# Patient Record
Sex: Female | Born: 1995 | Race: Black or African American | Hispanic: No | Smoking: Never smoker
Health system: Southern US, Community
[De-identification: ages and names within clinical notes are randomized; demographics above are authoritative.]

## PROBLEM LIST (undated history)

## (undated) DIAGNOSIS — Z789 Other specified health status: Secondary | ICD-10-CM

---
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
## 2018-02-28 ENCOUNTER — Ambulatory Visit (HOSPITAL_COMMUNITY)
Admission: EM | Admit: 2018-02-28 | Discharge: 2018-02-28 | Disposition: A | Discharge status: Home / Self Care | Payer: BLUE CROSS/BLUE SHIELD | Attending: Family Medicine | Admitting: Family Medicine

## 2018-02-28 ENCOUNTER — Encounter (HOSPITAL_COMMUNITY): Payer: Self-pay | Admitting: Emergency Medicine

## 2018-02-28 DIAGNOSIS — K648 Other hemorrhoids: Secondary | ICD-10-CM | POA: Diagnosis not present

## 2018-02-28 DIAGNOSIS — K625 Hemorrhage of anus and rectum: Secondary | ICD-10-CM | POA: Diagnosis not present

## 2018-02-28 DIAGNOSIS — K649 Unspecified hemorrhoids: Secondary | ICD-10-CM

## 2018-02-28 MED ORDER — RANITIDINE HCL 150 MG PO CAPS: 150.0000 mg | ORAL_CAPSULE | Freq: Every day | ORAL | 0 refills | Status: DC

## 2018-02-28 MED ORDER — HYDROCORTISONE 1 % RE CREA: 1.0000 | TOPICAL_CREAM | Freq: Two times a day (BID) | RECTAL | 0 refills | Status: DC

## 2018-02-28 NOTE — ED Triage Notes (Signed)
Pt here for bright red blood in stool today; pt denies pain and thinks she could have hemorrhoids

## 2018-02-28 NOTE — Discharge Instructions (Addendum)
It was nice meeting you!!  You have a small external hemorrhoid. We will give you some cream for that.  I am also giving you some medication for acid reflux.  Follow up as needed for continued or worsening symptoms. Avoid spicy, greasy foods, caffeine, chocolate and milk products.  No eating 2-3 hours before bedtime. Elevate the head of the bed 30 degrees.  Try this for a few weeks to see if this improves your symptoms.  If you don't see any improvement or your symptoms worsen please follow up with a GI

## 2018-02-28 NOTE — ED Provider Notes (Addendum)
[facility]    CSN: [id number] Arrival date & time: 02/28/18  1040     History   Chief Complaint Chief Complaint  Patient presents with  . Rectal Bleeding    HPI Sylvia Mack is a 22 y.o. female.   Pt is a healthy 22 year old female that presents with rectal bleeding this am. She had a BM and noticed bright red blood in the toilet and when she wiped.  She has been having some constipation issues due to diet change from acid reflux.She has been taking TUMS for the acid reflux.  She had some rectal pain previously but that has improved. She denies any abdominal pain, dysuria, hematuria, vaginal discharge or bleeding. She denies any vomiting or diarrhea.   She does not smoke. No hx of hemorrhoids.  No related family hx.  Past medical hx reviewed.   ROS per HPI      History reviewed. No pertinent past medical history.  There are no active problems to display for this patient.   History reviewed. No pertinent surgical history.  OB History   None      Home Medications    Prior to Admission medications   Medication Sig Start Date End Date Taking? Authorizing Provider  hydrocortisone (PROCTOCORT) 1 % CREA Apply 1 Tube topically 2 (two) times daily. 02/28/18   Dahlia Byes A, NP  ranitidine (ZANTAC) 150 MG capsule Take 1 capsule (150 mg total) by mouth daily. 02/28/18   Janace Aris, NP    Family History History reviewed. No pertinent family history.  Social History Social History   Tobacco Use  . Smoking status: Never Smoker  . Smokeless tobacco: Never Used  Substance Use Topics  . Alcohol use: Not on file  . Drug use: Never     Allergies   Patient has no known allergies.   Review of Systems Review of Systems   Physical Exam Triage Vital Signs ED Triage Vitals [02/28/18 1053]  Enc Vitals Group     BP (!) 131/59     Pulse Rate 80     Resp 16     Temp 99 F (37.2 C)     Temp Source Oral     SpO2 100 %     Weight      Height      Head Circumference      Peak Flow      Pain Score      Pain Loc      Pain Edu?      Excl. in [place]?    No data found.  Updated Vital Signs BP (!) 131/59 (BP Location: Left Arm)   Pulse 80   Temp 99 F (37.2 C) (Oral)   Resp 16   SpO2 100%   Visual Acuity Right Eye Distance:   Left Eye Distance:   Bilateral Distance:    Right Eye Near:   Left Eye Near:    Bilateral Near:     Physical Exam  Constitutional: She is oriented to person, place, and time. She appears well-developed and well-nourished.  Very pleasant. Non toxic or ill appearing.   HENT:  Head: Normocephalic and atraumatic.  Eyes: Conjunctivae are normal.  Neck: Normal range of motion.  Pulmonary/Chest: Effort normal.  Abdominal: Soft. Bowel sounds are normal.  Non tender to palpation. No masses. Soft.   Genitourinary:    Pelvic exam was performed with patient in the knee-chest position.  Genitourinary Comments: Small external non thrombosed  hemorrhoid. Non tender to touch.   Musculoskeletal: Normal range of motion.  Neurological: She is oriented to person, place, and time.  Skin: Skin is warm and dry.  Psychiatric: She has a normal mood and affect.  Nursing note and vitals reviewed.    UC Treatments / Results  Labs (all labs ordered are listed, but only abnormal results are displayed) Labs Reviewed - No data to display  EKG None  Radiology No results found.  Procedures Procedures (including critical care time)  Medications Ordered in UC Medications - No data to display  Initial Impression / Assessment and Plan / UC Course  I have reviewed the triage vital signs and the nursing notes.  Pertinent labs & imaging results that were available during my care of the patient were reviewed by me and considered in my medical decision making (see chart for details).     External nonthrombosed hemorrhoid.  Will treat with cortisone rectal cream twice daily. Will prescribe ranitidine for acid  reflux. If her symptoms do not resolve or for worsening symptoms she will need to follow-up with GI.  Contact given for GI.  She agreeable to plan Final Clinical Impressions(s) / UC Diagnoses   Final diagnoses:  Hemorrhoids, unspecified hemorrhoid type     Discharge Instructions     It was nice meeting you!!  You have a small external hemorrhoid. We will give you some cream for that.  I am also giving you some medication for acid reflux.  Follow up as needed for continued or worsening symptoms. Avoid spicy, greasy foods, caffeine, chocolate and milk products.  No eating 2-3 hours before bedtime. Elevate the head of the bed 30 degrees.  Try this for a few weeks to see if this improves your symptoms.  If you don't see any improvement or your symptoms worsen please follow up with a GI      ED Prescriptions    Medication Sig Dispense Auth. Provider   hydrocortisone (PROCTOCORT) 1 % CREA Apply 1 Tube topically 2 (two) times daily. 1 Tube Liyana Suniga A, NP   ranitidine (ZANTAC) 150 MG capsule Take 1 capsule (150 mg total) by mouth daily. 30 capsule Dahlia ByesBast, Royalty Domagala A, NP     Controlled Substance Prescriptions [place] Controlled Substance Registry consulted? Not Applicable       Janace ArisBast, Shane Melby A, NP 02/28/18 1154

## 2020-03-08 ENCOUNTER — Ambulatory Visit (HOSPITAL_COMMUNITY)
Admission: EM | Admit: 2020-03-08 | Discharge: 2020-03-08 | Disposition: A | Discharge status: Home / Self Care | Payer: BC Managed Care – PPO | Attending: Internal Medicine | Admitting: Internal Medicine

## 2020-03-08 ENCOUNTER — Encounter (HOSPITAL_COMMUNITY): Payer: Self-pay | Admitting: *Deleted

## 2020-03-08 ENCOUNTER — Other Ambulatory Visit: Payer: Self-pay

## 2020-03-08 DIAGNOSIS — B349 Viral infection, unspecified: Secondary | ICD-10-CM | POA: Diagnosis present

## 2020-03-08 DIAGNOSIS — Z20822 Contact with and (suspected) exposure to covid-19: Secondary | ICD-10-CM | POA: Diagnosis present

## 2020-03-08 LAB — SARS CORONAVIRUS 2 (TAT 6-24 HRS): SARS Coronavirus 2: POSITIVE — AB

## 2020-03-08 MED ORDER — ONDANSETRON 4 MG PO TBDP: 4.0000 mg | ORAL_TABLET | Freq: Three times a day (TID) | ORAL | 0 refills | Status: DC | PRN

## 2020-03-08 MED ORDER — ALBUTEROL SULFATE HFA 108 (90 BASE) MCG/ACT IN AERS: 2.0000 | INHALATION_SPRAY | RESPIRATORY_TRACT | 0 refills | Status: DC | PRN

## 2020-03-08 NOTE — ED Triage Notes (Signed)
Patient reports sinus infection that turned into right ear pain and now night sweats.   Reports fatigue and anorexia. 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

## 2020-03-08 NOTE — Discharge Instructions (Addendum)
You were seen at the Urgent Care for sinus problems. Please pick up your prescriptions at your pharmacy. Be sure to stat hydrated. Follow up with your PCP as needed.   If you haven't already, sign up for My Chart to have easy access to your labs results, and communication with your primary care physician.  Dr. Rachael Darby

## 2020-03-08 NOTE — ED Provider Notes (Signed)
[facility]    CSN: [id number] Arrival date & time: 03/08/20  5400      History   Chief Complaint Chief Complaint  Patient presents with  . Sinusitis  . Otalgia  . Covid Exposure    HPI Sylvia Mack is a 24 y.o. female.   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  Started feeling bad on Sunday but is feeling better however she is still fatigue.   Sinus problems typically but it turned into night sweats, decreased appetite, loss of smell, dry cough, sore throat in the morning, headaches. States she had non-bloody, non-bilious emesis once yesterday. Denies fever, abdominal pain. Taken Vicks Sinus liquid gels and saline nose spray that was helping.   History reviewed. No pertinent past medical history.  There are no problems to display for this patient.   History reviewed. No pertinent surgical history.  OB History   No obstetric history on file.      Home Medications    Prior to Admission medications   Medication Sig Start Date End Date Taking? Authorizing Provider  fexofenadine (ALLEGRA) 180 MG tablet Take 180 mg by mouth daily.   Yes [provider]  albuterol (VENTOLIN HFA) 108 (90 Base) MCG/ACT inhaler Inhale 2 puffs into the lungs every 4 (four) hours as needed for wheezing or shortness of breath. 03/08/20   Bronwyn Belasco, Seward Meth, DO  hydrocortisone (PROCTOCORT) 1 % CREA Apply 1 Tube topically 2 (two) times daily. 02/28/18   Bast, Gloris Manchester A, NP  ondansetron (ZOFRAN ODT) 4 MG disintegrating tablet Take 1 tablet (4 mg total) by mouth every 8 (eight) hours as needed for nausea or vomiting. 03/08/20   Aleera Gilcrease, Seward Meth, DO  ranitidine (ZANTAC) 150 MG capsule Take 1 capsule (150 mg total) by mouth daily. 02/28/18   Janace Aris, NP    Family History History reviewed. No pertinent family history.  Social History Social History   Tobacco Use  . Smoking status: Never Smoker  . Smokeless  tobacco: Never Used  Substance Use Topics  . Alcohol use: Not on file  . Drug use: Never     Allergies   Patient has no known allergies.   Review of Systems Review of Systems  Constitutional: Positive for appetite change, chills and fatigue. Negative for fever.  HENT: Positive for ear pain (right) and sore throat.   Eyes: Negative for redness and itching.  Respiratory: Positive for cough. Negative for shortness of breath.   Cardiovascular: Positive for chest pain (tightness).  Gastrointestinal: Positive for nausea and vomiting. Negative for abdominal pain.  Genitourinary: Negative for dysuria.  Musculoskeletal: Negative for back pain.  Neurological: Positive for headaches.  All other systems reviewed and are negative.    Physical Exam Triage Vital Signs ED Triage Vitals  Enc Vitals Group     BP 03/08/20 0855 112/78     Pulse Rate 03/08/20 0855 (!) 103     Resp 03/08/20 0855 16     Temp 03/08/20 0855 99.3 F (37.4 C)     Temp Source 03/08/20 0855 Oral     SpO2 03/08/20 0855 100 %     Weight --      Height --      Head Circumference --      Peak Flow --      Pain Score 03/08/20 0851 3     Pain Loc --  Pain Edu? --      Excl. in [place]? --    No data found.  Updated Vital Signs BP 112/78 (BP Location: Left Arm)   Pulse (!) 103   Temp 99.3 F (37.4 C) (Oral)   Resp 16   SpO2 100%   Visual Acuity Right Eye Distance:   Left Eye Distance:   Bilateral Distance:    Right Eye Near:   Left Eye Near:    Bilateral Near:     Physical Exam Vitals and nursing note reviewed.  Constitutional:      Appearance: Normal appearance.  HENT:     Head: Normocephalic and atraumatic.     Right Ear: Tympanic membrane, ear canal and external ear normal.     Left Ear: Tympanic membrane, ear canal and external ear normal.     Nose: Nose normal. No congestion.     Mouth/Throat:     Mouth: Mucous membranes are moist.     Pharynx: Oropharynx is clear. No oropharyngeal exudate  or posterior oropharyngeal erythema.  Eyes:     General:        Right eye: No discharge.        Left eye: No discharge.     Extraocular Movements: Extraocular movements intact.     Conjunctiva/sclera: Conjunctivae normal.     Pupils: Pupils are equal, round, and reactive to light.  Cardiovascular:     Rate and Rhythm: Regular rhythm. Tachycardia present.     Pulses: Normal pulses.     Heart sounds: Normal heart sounds. No murmur heard.   Pulmonary:     Effort: Pulmonary effort is normal. No respiratory distress.     Breath sounds: Normal breath sounds.  Abdominal:     General: Bowel sounds are normal.     Palpations: Abdomen is soft.     Tenderness: There is no abdominal tenderness. There is no guarding or rebound.  Musculoskeletal:        General: Normal range of motion.     Cervical back: Normal range of motion. No tenderness.  Skin:    General: Skin is warm and dry.     Capillary Refill: Capillary refill takes less than 2 seconds.  Neurological:     Mental Status: She is alert and oriented to person, place, and time. Mental status is at baseline.  Psychiatric:        Mood and Affect: Mood normal.        Behavior: Behavior normal.        Thought Content: Thought content normal.      UC Treatments / Results  Labs (all labs ordered are listed, but only abnormal results are displayed) Labs Reviewed  SARS CORONAVIRUS 2 (TAT 6-24 HRS)    EKG   Radiology No results found.  Procedures Procedures (including critical care time)  Medications Ordered in UC Medications - No data to display  Initial Impression / Assessment and Plan / UC Course  I have reviewed the triage vital signs and the nursing notes.  Pertinent labs & imaging results that were available during my care of the patient were reviewed by me and considered in my medical decision making (see chart for details).     Exam consistent with URI. Overall pt is well appearing, well hydrated, without  respiratory distress. Discussed symptomatic treatment. COVID test pending.  - continue to monitor for fevers  - continue Tylenol/ Motrin as needed for discomfort - nasal saline to help with his nasal congestion - Use  a cool mist humidifier at bedtime to help with breathing - Stressed hydration - Albuterol to help with chest tightness   - Zofran for nausea  - Discussed return precautions, understanding voiced  Final Clinical Impressions(s) / UC Diagnoses   Final diagnoses:  Viral illness  Encounter for laboratory testing for COVID-19 virus     Discharge Instructions     You were seen at the Urgent Care for sinus problems. Please pick up your prescriptions at your pharmacy. Be sure to stat hydrated. Follow up with your PCP as needed.   If you haven't already, sign up for My Chart to have easy access to your labs results, and communication with your primary care physician.  Dr. Rachael Darby     ED Prescriptions    Medication Sig Dispense Auth. Provider   albuterol (VENTOLIN HFA) 108 (90 Base) MCG/ACT inhaler Inhale 2 puffs into the lungs every 4 (four) hours as needed for wheezing or shortness of breath. 1 each Trigo Winterbottom, DO   ondansetron (ZOFRAN ODT) 4 MG disintegrating tablet Take 1 tablet (4 mg total) by mouth every 8 (eight) hours as needed for nausea or vomiting. 20 tablet Katha Cabal, DO     PDMP not reviewed this encounter.   Katha Cabal, DO 03/08/20 [id number]

## 2020-03-09 ENCOUNTER — Ambulatory Visit (HOSPITAL_COMMUNITY): Payer: Self-pay

## 2021-12-04 ENCOUNTER — Ambulatory Visit (HOSPITAL_COMMUNITY): Payer: Self-pay

## 2021-12-09 ENCOUNTER — Ambulatory Visit: Payer: Medicaid Other

## 2021-12-19 LAB — HEPATITIS C ANTIBODY: HCV Ab: NEGATIVE

## 2021-12-19 LAB — OB RESULTS CONSOLE HEPATITIS B SURFACE ANTIGEN: Hepatitis B Surface Ag: NEGATIVE

## 2021-12-19 LAB — OB RESULTS CONSOLE HIV ANTIBODY (ROUTINE TESTING): HIV: NONREACTIVE

## 2021-12-19 LAB — OB RESULTS CONSOLE RUBELLA ANTIBODY, IGM: Rubella: IMMUNE

## 2022-01-16 LAB — OB RESULTS CONSOLE GC/CHLAMYDIA
Chlamydia: NEGATIVE
Neisseria Gonorrhea: NEGATIVE

## 2022-04-23 LAB — OB RESULTS CONSOLE RPR: RPR: NONREACTIVE

## 2022-07-07 ENCOUNTER — Encounter (HOSPITAL_COMMUNITY): Payer: Self-pay | Admitting: *Deleted

## 2022-07-07 ENCOUNTER — Ambulatory Visit (HOSPITAL_COMMUNITY)
Admission: EM | Admit: 2022-07-07 | Discharge: 2022-07-07 | Disposition: A | Discharge status: Home / Self Care | Payer: BC Managed Care – PPO | Attending: Internal Medicine | Admitting: Internal Medicine

## 2022-07-07 ENCOUNTER — Other Ambulatory Visit: Payer: Self-pay

## 2022-07-07 DIAGNOSIS — R062 Wheezing: Secondary | ICD-10-CM | POA: Diagnosis not present

## 2022-07-07 DIAGNOSIS — J209 Acute bronchitis, unspecified: Secondary | ICD-10-CM | POA: Diagnosis not present

## 2022-07-07 DIAGNOSIS — O99513 Diseases of the respiratory system complicating pregnancy, third trimester: Secondary | ICD-10-CM

## 2022-07-07 DIAGNOSIS — R0602 Shortness of breath: Secondary | ICD-10-CM | POA: Diagnosis not present

## 2022-07-07 DIAGNOSIS — O26893 Other specified pregnancy related conditions, third trimester: Secondary | ICD-10-CM

## 2022-07-07 DIAGNOSIS — Z3A35 35 weeks gestation of pregnancy: Secondary | ICD-10-CM

## 2022-07-07 MED ORDER — ALBUTEROL SULFATE HFA 108 (90 BASE) MCG/ACT IN AERS: 1.0000 | INHALATION_SPRAY | Freq: Four times a day (QID) | RESPIRATORY_TRACT | 0 refills | Status: DC | PRN

## 2022-07-07 MED ORDER — ALBUTEROL SULFATE (2.5 MG/3ML) 0.083% IN NEBU
2.5000 mg | INHALATION_SOLUTION | Freq: Once | RESPIRATORY_TRACT | Status: AC
  Administered 2022-07-07: 2.5 mg via RESPIRATORY_TRACT

## 2022-07-07 MED ORDER — ALBUTEROL SULFATE (2.5 MG/3ML) 0.083% IN NEBU
INHALATION_SOLUTION | RESPIRATORY_TRACT | Status: AC
  Filled 2022-07-07: qty 3

## 2022-07-07 MED ORDER — GUAIFENESIN ER 600 MG PO TB12: 600.0000 mg | ORAL_TABLET | Freq: Two times a day (BID) | ORAL | 0 refills | Status: DC

## 2022-07-07 NOTE — Discharge Instructions (Signed)
You have bronchitis which is inflammation of the upper airway/lungs. You may use albuterol inhaler every 4-6 hours as needed for cough, shortness of breath, and wheeze. Rinse out your mouth after using this medicine.  You may take Mucinex every 12 hours as needed for nasal congestion and cough.  Refer to the list of medications safe in pregnancy for other symptomatic relief.   If you develop any new or worsening symptoms or do not improve in the next 2 to 3 days, please return.  If your symptoms are severe, please go to the emergency room.  Follow-up with your primary care provider for further evaluation and management of your symptoms as well as ongoing wellness visits.  I hope you feel better!

## 2022-07-07 NOTE — ED Provider Notes (Signed)
[facility]    CSN: [id number] Arrival date & time: 07/07/22  1314      History   Chief Complaint Chief Complaint  Patient presents with   pregnancy    HPI Sylvia Mack is a 27 y.o. female.   HPI  History reviewed. No pertinent past medical history.  There are no problems to display for this patient.   History reviewed. No pertinent surgical history.  OB History     Gravida  1   Para      Term      Preterm      AB      Living         SAB      IAB      Ectopic      Multiple      Live Births               Home Medications    Prior to Admission medications   Medication Sig Start Date End Date Taking? Authorizing Provider  albuterol (VENTOLIN HFA) 108 (90 Base) MCG/ACT inhaler Inhale 2 puffs into the lungs every 4 (four) hours as needed for wheezing or shortness of breath. 03/08/20   Brimage, Ronnette Juniper, DO  fexofenadine (ALLEGRA) 180 MG tablet Take 180 mg by mouth daily.    [provider]  hydrocortisone (PROCTOCORT) 1 % CREA Apply 1 Tube topically 2 (two) times daily. 02/28/18   Bast, Tressia Miners A, NP  ondansetron (ZOFRAN ODT) 4 MG disintegrating tablet Take 1 tablet (4 mg total) by mouth every 8 (eight) hours as needed for nausea or vomiting. 03/08/20   Brimage, Ronnette Juniper, DO  ranitidine (ZANTAC) 150 MG capsule Take 1 capsule (150 mg total) by mouth daily. 02/28/18   Orvan July, NP    Family History History reviewed. No pertinent family history.  Social History Social History   Tobacco Use   Smoking status: Never   Smokeless tobacco: Never  Substance Use Topics   Drug use: Never     Allergies   Patient has no known allergies.   Review of Systems Review of Systems   Physical Exam Triage Vital Signs ED Triage Vitals [07/07/22 1319]  Enc Vitals Group     BP 125/89     Pulse Rate 95     Resp 20     Temp 98.1 F (36.7 C)     Temp src      SpO2 99 %     Weight      Height      Head Circumference      Peak  Flow      Pain Score      Pain Loc      Pain Edu?      Excl. in [place]?    No data found.  Updated Vital Signs BP 125/89   Pulse 95   Temp 98.1 F (36.7 C)   Resp 20   LMP  (LMP Unknown)   SpO2 99%   Visual Acuity Right Eye Distance:   Left Eye Distance:   Bilateral Distance:    Right Eye Near:   Left Eye Near:    Bilateral Near:     Physical Exam   UC Treatments / Results  Labs (all labs ordered are listed, but only abnormal results are displayed) Labs Reviewed - No data to display  EKG   Radiology No results found.  Procedures Procedures (including critical care time)  Medications Ordered in UC Medications  albuterol (PROVENTIL) (2.5 MG/3ML) 0.083% nebulizer solution 2.5 mg (has no administration in time range)    Initial Impression / Assessment and Plan / UC Course  I have reviewed the triage vital signs and the nursing notes.  Pertinent labs & imaging results that were available during my care of the patient were reviewed by me and considered in my medical decision making (see chart for details).     *** Final Clinical Impressions(s) / UC Diagnoses   Final diagnoses:  None   Discharge Instructions   None    ED Prescriptions   None    PDMP not reviewed this encounter.

## 2022-07-07 NOTE — ED Triage Notes (Addendum)
Pt reports SHOB for 2 nights. Pt SPO2 100% in triage on RA. Pt reports throat is swollen Pt has rt ear pain ,sore throat and HA. Pt reports she is [redacted] weeks gestation and has talked to [facility] MD

## 2022-07-16 LAB — OB RESULTS CONSOLE GBS: GBS: NEGATIVE

## 2022-08-06 ENCOUNTER — Encounter: Payer: Self-pay | Admitting: Pediatrics

## 2022-08-06 ENCOUNTER — Ambulatory Visit (INDEPENDENT_AMBULATORY_CARE_PROVIDER_SITE_OTHER): Payer: BC Managed Care – PPO | Admitting: Pediatrics

## 2022-08-06 DIAGNOSIS — Z7681 Expectant parent(s) prebirth pediatrician visit: Secondary | ICD-10-CM

## 2022-08-06 NOTE — Progress Notes (Signed)
Prenatal counseling for impending newborn done-- Z76.81  

## 2022-08-09 ENCOUNTER — Other Ambulatory Visit: Payer: Self-pay

## 2022-08-09 ENCOUNTER — Inpatient Hospital Stay (HOSPITAL_COMMUNITY)
Admission: AD | Admit: 2022-08-09 | Discharge: 2022-08-12 | DRG: 787 | Disposition: A | Discharge status: Home / Self Care | Payer: BC Managed Care – PPO | Attending: Obstetrics and Gynecology | Admitting: Obstetrics and Gynecology

## 2022-08-09 ENCOUNTER — Inpatient Hospital Stay (HOSPITAL_COMMUNITY): Payer: BC Managed Care – PPO | Admitting: Anesthesiology

## 2022-08-09 ENCOUNTER — Encounter (HOSPITAL_COMMUNITY): Payer: Self-pay

## 2022-08-09 DIAGNOSIS — O9902 Anemia complicating childbirth: Secondary | ICD-10-CM | POA: Diagnosis present

## 2022-08-09 DIAGNOSIS — O134 Gestational [pregnancy-induced] hypertension without significant proteinuria, complicating childbirth: Principal | ICD-10-CM | POA: Diagnosis present

## 2022-08-09 DIAGNOSIS — Z3A40 40 weeks gestation of pregnancy: Secondary | ICD-10-CM | POA: Diagnosis not present

## 2022-08-09 DIAGNOSIS — O480 Post-term pregnancy: Secondary | ICD-10-CM | POA: Diagnosis not present

## 2022-08-09 DIAGNOSIS — D62 Acute posthemorrhagic anemia: Secondary | ICD-10-CM | POA: Diagnosis not present

## 2022-08-09 DIAGNOSIS — O99214 Obesity complicating childbirth: Secondary | ICD-10-CM | POA: Diagnosis present

## 2022-08-09 DIAGNOSIS — O26893 Other specified pregnancy related conditions, third trimester: Secondary | ICD-10-CM | POA: Diagnosis present

## 2022-08-09 DIAGNOSIS — Z98891 History of uterine scar from previous surgery: Principal | ICD-10-CM

## 2022-08-09 HISTORY — DX: Other specified health status: Z78.9

## 2022-08-09 LAB — CBC
HCT: 34.8 % — ABNORMAL LOW (ref 36.0–46.0)
Hemoglobin: 12 g/dL (ref 12.0–15.0)
MCH: 28.3 pg (ref 26.0–34.0)
MCHC: 34.5 g/dL (ref 30.0–36.0)
MCV: 82.1 fL (ref 80.0–100.0)
Platelets: 309 10*3/uL (ref 150–400)
RBC: 4.24 MIL/uL (ref 3.87–5.11)
RDW: 13.2 % (ref 11.5–15.5)
WBC: 12.2 10*3/uL — ABNORMAL HIGH (ref 4.0–10.5)
nRBC: 0 % (ref 0.0–0.2)

## 2022-08-09 LAB — URINALYSIS, ROUTINE W REFLEX MICROSCOPIC
Bilirubin Urine: NEGATIVE
Glucose, UA: NEGATIVE mg/dL
Hgb urine dipstick: NEGATIVE
Ketones, ur: NEGATIVE mg/dL
Nitrite: NEGATIVE
Protein, ur: NEGATIVE mg/dL
Specific Gravity, Urine: 1.014 (ref 1.005–1.030)
pH: 6 (ref 5.0–8.0)

## 2022-08-09 LAB — COMPREHENSIVE METABOLIC PANEL
ALT: 10 U/L (ref 0–44)
AST: 22 U/L (ref 15–41)
Albumin: 2.9 g/dL — ABNORMAL LOW (ref 3.5–5.0)
Alkaline Phosphatase: 136 U/L — ABNORMAL HIGH (ref 38–126)
Anion gap: 9 (ref 5–15)
BUN: 5 mg/dL — ABNORMAL LOW (ref 6–20)
CO2: 22 mmol/L (ref 22–32)
Calcium: 9.1 mg/dL (ref 8.9–10.3)
Chloride: 103 mmol/L (ref 98–111)
Creatinine, Ser: 0.68 mg/dL (ref 0.44–1.00)
GFR, Estimated: >60 mL/min (ref >60)
Glucose, Bld: 83 mg/dL (ref 70–99)
Potassium: 4 mmol/L (ref 3.5–5.1)
Sodium: 134 mmol/L — ABNORMAL LOW (ref 135–145)
Total Bilirubin: 0.5 mg/dL (ref 0.3–1.2)
Total Protein: 6.5 g/dL (ref 6.5–8.1)

## 2022-08-09 LAB — TYPE AND SCREEN
ABO/RH(D): B POS
Antibody Screen: NEGATIVE

## 2022-08-09 MED ORDER — EPHEDRINE 5 MG/ML INJ: 10.0000 mg | INTRAVENOUS | Status: DC | PRN

## 2022-08-09 MED ORDER — ONDANSETRON HCL 4 MG/2ML IJ SOLN: 4.0000 mg | Freq: Four times a day (QID) | INTRAMUSCULAR | Status: DC | PRN

## 2022-08-09 MED ORDER — PHENYLEPHRINE 80 MCG/ML (10ML) SYRINGE FOR IV PUSH (FOR BLOOD PRESSURE SUPPORT): 80.0000 ug | PREFILLED_SYRINGE | INTRAVENOUS | Status: DC | PRN

## 2022-08-09 MED ORDER — SOD CITRATE-CITRIC ACID 500-334 MG/5ML PO SOLN: 30.0000 mL | ORAL | Status: DC | PRN

## 2022-08-09 MED ORDER — FLEET ENEMA 7-19 GM/118ML RE ENEM: 1.0000 | ENEMA | RECTAL | Status: DC | PRN

## 2022-08-09 MED ORDER — LACTATED RINGERS IV SOLN: 500.0000 mL | Freq: Once | INTRAVENOUS | Status: DC

## 2022-08-09 MED ORDER — OXYTOCIN-SODIUM CHLORIDE 30-0.9 UT/500ML-% IV SOLN
2.5000 [IU]/h | INTRAVENOUS | Status: DC
  Filled 2022-08-09: qty 500

## 2022-08-09 MED ORDER — LIDOCAINE HCL (PF) 1 % IJ SOLN: 30.0000 mL | INTRAMUSCULAR | Status: DC | PRN

## 2022-08-09 MED ORDER — FENTANYL-BUPIVACAINE-NACL 0.5-0.125-0.9 MG/250ML-% EP SOLN
EPIDURAL | Status: AC
  Filled 2022-08-09: qty 250

## 2022-08-09 MED ORDER — FENTANYL-BUPIVACAINE-NACL 0.5-0.125-0.9 MG/250ML-% EP SOLN
12.0000 mL/h | EPIDURAL | Status: DC | PRN
  Administered 2022-08-09: 12 mL/h via EPIDURAL

## 2022-08-09 MED ORDER — OXYTOCIN BOLUS FROM INFUSION: 333.0000 mL | Freq: Once | INTRAVENOUS | Status: DC

## 2022-08-09 MED ORDER — DIPHENHYDRAMINE HCL 50 MG/ML IJ SOLN: 12.5000 mg | INTRAMUSCULAR | Status: DC | PRN

## 2022-08-09 MED ORDER — OXYCODONE-ACETAMINOPHEN 5-325 MG PO TABS: 2.0000 | ORAL_TABLET | ORAL | Status: DC | PRN

## 2022-08-09 MED ORDER — OXYCODONE-ACETAMINOPHEN 5-325 MG PO TABS: 1.0000 | ORAL_TABLET | ORAL | Status: DC | PRN

## 2022-08-09 MED ORDER — ACETAMINOPHEN 325 MG PO TABS: 650.0000 mg | ORAL_TABLET | ORAL | Status: DC | PRN

## 2022-08-09 MED ORDER — LACTATED RINGERS IV SOLN: 500.0000 mL | INTRAVENOUS | Status: DC | PRN

## 2022-08-09 MED ORDER — LIDOCAINE HCL (PF) 1 % IJ SOLN
INTRAMUSCULAR | Status: DC | PRN
  Administered 2022-08-09 (×2): 4 mL via EPIDURAL

## 2022-08-09 MED ORDER — LACTATED RINGERS IV SOLN: INTRAVENOUS | Status: DC

## 2022-08-09 NOTE — Anesthesia Procedure Notes (Signed)
Epidural Patient location during procedure: OB Start time: 08/09/2022 9:47 PM End time: 08/09/2022 9:50 PM  Staffing Anesthesiologist: Brennan Bailey, MD Performed: anesthesiologist   Preanesthetic Checklist Completed: patient identified, IV checked, risks and benefits discussed, monitors and equipment checked, pre-op evaluation and timeout performed  Epidural Patient position: sitting Prep: DuraPrep and site prepped and draped Patient monitoring: continuous pulse ox, blood pressure and heart rate Approach: midline Location: L3-L4 Injection technique: LOR air  Needle:  Needle type: Tuohy  Needle gauge: 17 G Needle length: 9 cm Needle insertion depth: 7 cm Catheter type: closed end flexible Catheter size: 19 Gauge Catheter at skin depth: 12 cm Test dose: negative and Other (1% lidocaine)  Assessment Events: blood not aspirated, no cerebrospinal fluid, injection not painful, no injection resistance, no paresthesia and negative IV test  Additional Notes Patient identified. Risks, benefits, and alternatives discussed with patient including but not limited to bleeding, infection, nerve damage, paralysis, failed block, incomplete pain control, headache, blood pressure changes, nausea, vomiting, reactions to medication, itching, and postpartum back pain. Confirmed with bedside nurse the patient's most recent platelet count. Confirmed with patient that they are not currently taking any anticoagulation, have any bleeding history, or any family history of bleeding disorders. Patient expressed understanding and wished to proceed. All questions were answered. Sterile technique was used throughout the entire procedure. Please see nursing notes for vital signs.   Crisp LOR on first pass. Test dose was given through epidural catheter and negative prior to continuing to dose epidural or start infusion. Warning signs of high block given to the patient including shortness of breath, tingling/numbness  in hands, complete motor block, or any concerning symptoms with instructions to call for help. Patient was given instructions on fall risk and not to get out of bed. All questions and concerns addressed with instructions to call with any issues or inadequate analgesia.  Reason for block:procedure for pain

## 2022-08-09 NOTE — MAU Note (Signed)
.  Sylvia Mack is a 27 y.o. at [redacted]w[redacted]d here in MAU reporting: she has been having ctx since 0700 this morning with ctx every 5 minutes (8/10). Denies VB but does reports clear LOF that began around 1100 today and has continued since. Reports good FM.  Last SVE on Tuesday: "almost 2 cm"  LMP: N/A Onset of complaint: Today Pain score: 8/10 Vitals:   08/09/22 2029 08/09/22 2031  BP: (!) 147/91 (!) 133/93  Pulse: 89 96     FHT:120 Lab orders placed from triage:  UA

## 2022-08-09 NOTE — H&P (Signed)
Sylvia Mack is a 27 y.o. female presenting for contractions and was found to be in labor with 5.5 cm/90%/-2 cervical exam. G1P0 at 39 weeks 6 days EGA, LMP4/10/23, EDD 08/10/22 by first trimester ultrasound. She reported leakage of fluid. Fern test was negative in MAU.  Prenatal care has been obtained from [facility], Dr. Delora Fuel  Prenatal course was otherwise benign.  OB History     Gravida  1   Para      Term      Preterm      AB      Living         SAB      IAB      Ectopic      Multiple      Live Births             Past Medical History:  Diagnosis Date   Medical history non-contributory    History reviewed. No pertinent surgical history. Family History: family history is not on file. Social History:  reports that she has never smoked. She has never used smokeless tobacco. She reports that she does not use drugs. No history on file for alcohol use.     Maternal Diabetes: No Genetic Screening: Declined Maternal Ultrasounds/Referrals: Normal Fetal Ultrasounds or other Referrals:  None Maternal Substance Abuse:  No Significant Maternal Medications:  None Significant Maternal Lab Results:  Group B Strep negative Number of Prenatal Visits:greater than 3 verified prenatal visits Other Comments:  None  Current Outpatient Medications  Medication Instructions   albuterol (VENTOLIN HFA) 108 (90 Base) MCG/ACT inhaler 1-2 puffs, Inhalation, Every 6 hours PRN   ferrous sulfate 325 mg, Oral, 3 times daily with meals   fexofenadine (ALLEGRA) 180 mg, Oral, Daily   guaiFENesin (MUCINEX) 600 mg, Oral, 2 times daily   hydrocortisone (PROCTOCORT) 1 % CREA 1 Tube, Topical, 2 times daily   ondansetron (ZOFRAN ODT) 4 mg, Oral, Every 8 hours PRN   Prenatal Vit-Fe Fumarate-FA (PRENATAL MULTIVITAMIN) TABS tablet 1 tablet, Oral, Daily   ranitidine (ZANTAC) 150 mg, Oral, Daily    No Known Allergies   Review of Systems Deferred.  History Dilation: 5.5 Effacement (%):  90 Station: -2 Exam by:: Herb Grays, RN Blood pressure (!) 150/96, pulse 86, temperature 98.9 F (37.2 C), temperature source Oral, resp. rate 20, height 5\' 1"  (1.549 m), weight 100.7 kg, SpO2 99 %. EFM reviewed at 9.30 PM: Baseline 140, moderate variability, reactive. TOCO: With irregular contractions every 1 to 4 minutes.      08/09/2022    9:28 PM 08/09/2022    9:11 PM 08/09/2022    9:08 PM  Vitals with BMI  Height 5\' 1"     Weight 222 lbs    BMI 00.86    Systolic 761 950 932  Diastolic 96 671 96  Pulse 86 82 80   Exam Physical Exam  Deferred Prenatal labs: ABO, Rh: --/--/B POS (02/04 2051) Antibody: NEG (02/04 2051) Rubella:  Immune RPR:   Non reactive HBsAg:  Neg  HIV:  Neg  GBS:   Neg   Recent Results (from the past 2160 hour(s))  Urinalysis, Routine w reflex microscopic -Urine, Clean Catch     Status: Abnormal   Collection Time: 08/09/22  8:51 PM  Result Value Ref Range   Color, Urine YELLOW YELLOW   APPearance HAZY (A) CLEAR   Specific Gravity, Urine 1.014 1.005 - 1.030   pH 6.0 5.0 - 8.0   Glucose, UA NEGATIVE  NEGATIVE mg/dL   Hgb urine dipstick NEGATIVE NEGATIVE   Bilirubin Urine NEGATIVE NEGATIVE   Ketones, ur NEGATIVE NEGATIVE mg/dL   Protein, ur NEGATIVE NEGATIVE mg/dL   Nitrite NEGATIVE NEGATIVE   Leukocytes,Ua SMALL (A) NEGATIVE   RBC / HPF 0-5 0 - 5 RBC/hpf   WBC, UA 11-20 0 - 5 WBC/hpf   Bacteria, UA RARE (A) NONE SEEN   Squamous Epithelial / HPF 11-20 0 - 5 /HPF   Mucus PRESENT     Comment: Performed at [facility], [place] [address] [place], [place] [address]  CBC     Status: Abnormal   Collection Time: 08/09/22  8:51 PM  Result Value Ref Range   WBC 12.2 (H) 4.0 - 10.5 K/uL   RBC 4.24 3.87 - 5.11 MIL/uL   Hemoglobin 12.0 12.0 - 15.0 g/dL   HCT 34.8 (L) 36.0 - 46.0 %   MCV 82.1 80.0 - 100.0 fL   MCH 28.3 26.0 - 34.0 pg   MCHC 34.5 30.0 - 36.0 g/dL   RDW 13.2 11.5 - 15.5 %   Platelets 309 150 - 400 K/uL   nRBC 0.0 0.0 - 0.2 %     Comment: Performed at [facility], [place] [address], [place], [place] [address]  Type and screen [place]     Status: None   Collection Time: 08/09/22  8:51 PM  Result Value Ref Range   ABO/RH(D) B POS    Antibody Screen NEG    Sample Expiration      08/12/2022,2359 Performed at [facility], [place] [address] [place],  [address]     Assessment/Plan: 27 y/o G1P0 in latent labor,  - Admit to Labor and Delivery as per admit orders.  - Preeclampsia labs to be drawn given mild range blood pressures.  - For cervical reassessment in about 2 hours.  - Anticipate NSVD.    Archie Endo, MD.  08/09/2022, 9:58 PM

## 2022-08-09 NOTE — Anesthesia Preprocedure Evaluation (Signed)
Anesthesia Evaluation  Patient identified by MRN, date of birth, ID band Patient awake    Reviewed: Allergy & Precautions, Patient's Chart, lab work & pertinent test results  History of Anesthesia Complications Negative for: history of anesthetic complications  Airway Mallampati: II  TM Distance: >3 FB Neck ROM: Full    Dental no notable dental hx.    Pulmonary neg pulmonary ROS   Pulmonary exam normal        Cardiovascular negative cardio ROS Normal cardiovascular exam     Neuro/Psych negative neurological ROS  negative psych ROS   GI/Hepatic negative GI ROS, Neg liver ROS,,,  Endo/Other    Morbid obesity  Renal/GU negative Renal ROS  negative genitourinary   Musculoskeletal negative musculoskeletal ROS (+)    Abdominal   Peds  Hematology negative hematology ROS (+)   Anesthesia Other Findings Day of surgery medications reviewed with patient.  Reproductive/Obstetrics (+) Pregnancy                             Anesthesia Physical Anesthesia Plan  ASA: 3  Anesthesia Plan: Epidural   Post-op Pain Management:    Induction:   PONV Risk Score and Plan: Treatment may vary due to age or medical condition  Airway Management Planned: Natural Airway  Additional Equipment: Fetal Monitoring  Intra-op Plan:   Post-operative Plan:   Informed Consent: I have reviewed the patients History and Physical, chart, labs and discussed the procedure including the risks, benefits and alternatives for the proposed anesthesia with the patient or authorized representative who has indicated his/her understanding and acceptance.       Plan Discussed with:   Anesthesia Plan Comments:        Anesthesia Quick Evaluation

## 2022-08-10 ENCOUNTER — Encounter (HOSPITAL_COMMUNITY)
Admission: AD | Disposition: A | Discharge status: Home / Self Care | Payer: Self-pay | Source: Home / Self Care | Attending: Obstetrics and Gynecology

## 2022-08-10 ENCOUNTER — Other Ambulatory Visit: Payer: Self-pay

## 2022-08-10 ENCOUNTER — Encounter (HOSPITAL_COMMUNITY): Payer: Self-pay | Admitting: Obstetrics & Gynecology

## 2022-08-10 DIAGNOSIS — O480 Post-term pregnancy: Secondary | ICD-10-CM | POA: Diagnosis not present

## 2022-08-10 DIAGNOSIS — O134 Gestational [pregnancy-induced] hypertension without significant proteinuria, complicating childbirth: Secondary | ICD-10-CM | POA: Diagnosis not present

## 2022-08-10 DIAGNOSIS — Z3A40 40 weeks gestation of pregnancy: Secondary | ICD-10-CM | POA: Diagnosis not present

## 2022-08-10 LAB — PROTEIN / CREATININE RATIO, URINE
Creatinine, Urine: 163 mg/dL
Protein Creatinine Ratio: 0.11 mg/mg{Cre} (ref 0.00–0.15)
Total Protein, Urine: 18 mg/dL

## 2022-08-10 LAB — RPR: RPR Ser Ql: NONREACTIVE

## 2022-08-10 SURGERY — Surgical Case
Anesthesia: Epidural

## 2022-08-10 MED ORDER — SODIUM CHLORIDE 0.9 % IR SOLN
Status: DC | PRN
  Administered 2022-08-10: 1 via INTRAVESICAL

## 2022-08-10 MED ORDER — PRENATAL MULTIVITAMIN CH
1.0000 | ORAL_TABLET | Freq: Every day | ORAL | Status: DC
  Administered 2022-08-10 – 2022-08-12 (×3): 1 via ORAL
  Filled 2022-08-10 (×3): qty 1

## 2022-08-10 MED ORDER — MENTHOL 3 MG MT LOZG: 1.0000 | LOZENGE | OROMUCOSAL | Status: DC | PRN

## 2022-08-10 MED ORDER — TERBUTALINE SULFATE 1 MG/ML IJ SOLN
0.2500 mg | Freq: Once | INTRAMUSCULAR | Status: AC | PRN
  Administered 2022-08-10: 0.25 mg via SUBCUTANEOUS
  Filled 2022-08-10: qty 1

## 2022-08-10 MED ORDER — KETOROLAC TROMETHAMINE 30 MG/ML IJ SOLN: 30.0000 mg | Freq: Four times a day (QID) | INTRAMUSCULAR | Status: DC | PRN

## 2022-08-10 MED ORDER — TRANEXAMIC ACID-NACL 1000-0.7 MG/100ML-% IV SOLN
INTRAVENOUS | Status: AC
  Filled 2022-08-10: qty 100

## 2022-08-10 MED ORDER — ACETAMINOPHEN 10 MG/ML IV SOLN
INTRAVENOUS | Status: DC | PRN
  Administered 2022-08-10: 1000 mg via INTRAVENOUS

## 2022-08-10 MED ORDER — COCONUT OIL OIL: 1.0000 | TOPICAL_OIL | Status: DC | PRN

## 2022-08-10 MED ORDER — DIPHENHYDRAMINE HCL 25 MG PO CAPS: 25.0000 mg | ORAL_CAPSULE | Freq: Four times a day (QID) | ORAL | Status: DC | PRN

## 2022-08-10 MED ORDER — KETOROLAC TROMETHAMINE 30 MG/ML IJ SOLN
INTRAMUSCULAR | Status: AC
  Filled 2022-08-10: qty 1

## 2022-08-10 MED ORDER — DIBUCAINE (PERIANAL) 1 % EX OINT: 1.0000 | TOPICAL_OINTMENT | CUTANEOUS | Status: DC | PRN

## 2022-08-10 MED ORDER — SCOPOLAMINE 1 MG/3DAYS TD PT72
MEDICATED_PATCH | TRANSDERMAL | Status: AC
  Filled 2022-08-10: qty 1

## 2022-08-10 MED ORDER — SODIUM CHLORIDE 0.9 % IV SOLN
INTRAVENOUS | Status: DC | PRN
  Administered 2022-08-10: 500 mg via INTRAVENOUS

## 2022-08-10 MED ORDER — SCOPOLAMINE 1 MG/3DAYS TD PT72
1.0000 | MEDICATED_PATCH | Freq: Once | TRANSDERMAL | Status: DC
  Administered 2022-08-10: 1.5 mg via TRANSDERMAL

## 2022-08-10 MED ORDER — IBUPROFEN 600 MG PO TABS
600.0000 mg | ORAL_TABLET | Freq: Four times a day (QID) | ORAL | Status: DC
  Administered 2022-08-11 – 2022-08-12 (×4): 600 mg via ORAL
  Filled 2022-08-10 (×5): qty 1

## 2022-08-10 MED ORDER — OXYCODONE HCL 5 MG PO TABS
5.0000 mg | ORAL_TABLET | ORAL | Status: DC | PRN
  Administered 2022-08-12: 10 mg via ORAL
  Administered 2022-08-12: 5 mg via ORAL
  Filled 2022-08-10: qty 1
  Filled 2022-08-10: qty 2

## 2022-08-10 MED ORDER — KETOROLAC TROMETHAMINE 30 MG/ML IJ SOLN
30.0000 mg | Freq: Four times a day (QID) | INTRAMUSCULAR | Status: AC
  Administered 2022-08-10 – 2022-08-11 (×4): 30 mg via INTRAVENOUS
  Filled 2022-08-10 (×4): qty 1

## 2022-08-10 MED ORDER — SODIUM CHLORIDE 0.9% FLUSH: 3.0000 mL | INTRAVENOUS | Status: DC | PRN

## 2022-08-10 MED ORDER — KETOROLAC TROMETHAMINE 30 MG/ML IJ SOLN
30.0000 mg | Freq: Four times a day (QID) | INTRAMUSCULAR | Status: DC | PRN
  Administered 2022-08-10: 30 mg via INTRAVENOUS

## 2022-08-10 MED ORDER — TRANEXAMIC ACID-NACL 1000-0.7 MG/100ML-% IV SOLN
INTRAVENOUS | Status: DC | PRN
  Administered 2022-08-10: 1000 mg via INTRAVENOUS

## 2022-08-10 MED ORDER — NALOXONE HCL 0.4 MG/ML IJ SOLN: 0.4000 mg | INTRAMUSCULAR | Status: DC | PRN

## 2022-08-10 MED ORDER — SIMETHICONE 80 MG PO CHEW
80.0000 mg | CHEWABLE_TABLET | Freq: Three times a day (TID) | ORAL | Status: DC
  Administered 2022-08-10 – 2022-08-12 (×6): 80 mg via ORAL
  Filled 2022-08-10 (×6): qty 1

## 2022-08-10 MED ORDER — NALOXONE HCL 4 MG/10ML IJ SOLN: 1.0000 ug/kg/h | INTRAVENOUS | Status: DC | PRN

## 2022-08-10 MED ORDER — OXYTOCIN-SODIUM CHLORIDE 30-0.9 UT/500ML-% IV SOLN: 2.5000 [IU]/h | INTRAVENOUS | Status: AC

## 2022-08-10 MED ORDER — ONDANSETRON HCL 4 MG/2ML IJ SOLN
INTRAMUSCULAR | Status: DC | PRN
  Administered 2022-08-10: 4 mg via INTRAVENOUS

## 2022-08-10 MED ORDER — DIPHENHYDRAMINE HCL 50 MG/ML IJ SOLN: 12.5000 mg | INTRAMUSCULAR | Status: DC | PRN

## 2022-08-10 MED ORDER — ONDANSETRON HCL 4 MG/2ML IJ SOLN
INTRAMUSCULAR | Status: AC
  Filled 2022-08-10: qty 4

## 2022-08-10 MED ORDER — OXYTOCIN-SODIUM CHLORIDE 30-0.9 UT/500ML-% IV SOLN: INTRAVENOUS | Status: DC | PRN

## 2022-08-10 MED ORDER — MORPHINE SULFATE (PF) 2 MG/ML IV SOLN: 1.0000 mg | INTRAVENOUS | Status: DC | PRN

## 2022-08-10 MED ORDER — CEFAZOLIN SODIUM-DEXTROSE 2-3 GM-%(50ML) IV SOLR
INTRAVENOUS | Status: DC | PRN
  Administered 2022-08-10: 2 g via INTRAVENOUS

## 2022-08-10 MED ORDER — STERILE WATER FOR IRRIGATION IR SOLN
Status: DC | PRN
  Administered 2022-08-10: 1000 mL

## 2022-08-10 MED ORDER — TERBUTALINE SULFATE 1 MG/ML IJ SOLN: 0.2500 mg | Freq: Once | INTRAMUSCULAR | Status: DC

## 2022-08-10 MED ORDER — DEXAMETHASONE SODIUM PHOSPHATE 10 MG/ML IJ SOLN
INTRAMUSCULAR | Status: DC | PRN
  Administered 2022-08-10: 10 mg via INTRAVENOUS

## 2022-08-10 MED ORDER — OXYTOCIN-SODIUM CHLORIDE 30-0.9 UT/500ML-% IV SOLN
INTRAVENOUS | Status: DC | PRN
  Administered 2022-08-10: 400 mL via INTRAVENOUS

## 2022-08-10 MED ORDER — ACETAMINOPHEN 500 MG PO TABS
1000.0000 mg | ORAL_TABLET | Freq: Four times a day (QID) | ORAL | Status: DC
  Administered 2022-08-10 – 2022-08-12 (×7): 1000 mg via ORAL
  Filled 2022-08-10 (×8): qty 2

## 2022-08-10 MED ORDER — OXYCODONE HCL 5 MG PO TABS: 5.0000 mg | ORAL_TABLET | Freq: Once | ORAL | Status: DC | PRN

## 2022-08-10 MED ORDER — LACTATED RINGERS IV SOLN: INTRAVENOUS | Status: DC

## 2022-08-10 MED ORDER — LIDOCAINE-EPINEPHRINE (PF) 2 %-1:200000 IJ SOLN
INTRAMUSCULAR | Status: AC
  Filled 2022-08-10: qty 20

## 2022-08-10 MED ORDER — ENOXAPARIN SODIUM 60 MG/0.6ML IJ SOSY
50.0000 mg | PREFILLED_SYRINGE | INTRAMUSCULAR | Status: DC
  Filled 2022-08-10: qty 0.6

## 2022-08-10 MED ORDER — FENTANYL CITRATE (PF) 100 MCG/2ML IJ SOLN: 25.0000 ug | INTRAMUSCULAR | Status: DC | PRN

## 2022-08-10 MED ORDER — SIMETHICONE 80 MG PO CHEW: 80.0000 mg | CHEWABLE_TABLET | ORAL | Status: DC | PRN

## 2022-08-10 MED ORDER — MORPHINE SULFATE (PF) 0.5 MG/ML IJ SOLN
INTRAMUSCULAR | Status: AC
  Filled 2022-08-10: qty 10

## 2022-08-10 MED ORDER — LACTATED RINGERS IV SOLN: INTRAVENOUS | Status: DC | PRN

## 2022-08-10 MED ORDER — TERBUTALINE SULFATE 1 MG/ML IJ SOLN
INTRAMUSCULAR | Status: AC
  Filled 2022-08-10: qty 1

## 2022-08-10 MED ORDER — DIPHENHYDRAMINE HCL 25 MG PO CAPS: 25.0000 mg | ORAL_CAPSULE | ORAL | Status: DC | PRN

## 2022-08-10 MED ORDER — MEPERIDINE HCL 25 MG/ML IJ SOLN: 6.2500 mg | INTRAMUSCULAR | Status: DC | PRN

## 2022-08-10 MED ORDER — ONDANSETRON HCL 4 MG/2ML IJ SOLN: 4.0000 mg | Freq: Three times a day (TID) | INTRAMUSCULAR | Status: DC | PRN

## 2022-08-10 MED ORDER — MORPHINE SULFATE (PF) 0.5 MG/ML IJ SOLN
INTRAMUSCULAR | Status: DC | PRN
  Administered 2022-08-10: 3 mg via EPIDURAL

## 2022-08-10 MED ORDER — OXYTOCIN-SODIUM CHLORIDE 30-0.9 UT/500ML-% IV SOLN: 1.0000 m[IU]/min | INTRAVENOUS | Status: DC

## 2022-08-10 MED ORDER — DEXAMETHASONE SODIUM PHOSPHATE 10 MG/ML IJ SOLN
INTRAMUSCULAR | Status: AC
  Filled 2022-08-10: qty 1

## 2022-08-10 MED ORDER — OXYCODONE HCL 5 MG/5ML PO SOLN: 5.0000 mg | Freq: Once | ORAL | Status: DC | PRN

## 2022-08-10 MED ORDER — WITCH HAZEL-GLYCERIN EX PADS: 1.0000 | MEDICATED_PAD | CUTANEOUS | Status: DC | PRN

## 2022-08-10 MED ORDER — LIDOCAINE-EPINEPHRINE (PF) 2 %-1:200000 IJ SOLN
INTRAMUSCULAR | Status: DC | PRN
  Administered 2022-08-10: 5 mL via EPIDURAL
  Administered 2022-08-10: 10 mL via EPIDURAL

## 2022-08-10 MED ORDER — ZOLPIDEM TARTRATE 5 MG PO TABS: 5.0000 mg | ORAL_TABLET | Freq: Every evening | ORAL | Status: DC | PRN

## 2022-08-10 MED ORDER — SENNOSIDES-DOCUSATE SODIUM 8.6-50 MG PO TABS
2.0000 | ORAL_TABLET | Freq: Every day | ORAL | Status: DC
  Administered 2022-08-11 – 2022-08-12 (×2): 2 via ORAL
  Filled 2022-08-10 (×3): qty 2

## 2022-08-10 MED ORDER — PROMETHAZINE HCL 25 MG/ML IJ SOLN: 6.2500 mg | INTRAMUSCULAR | Status: DC | PRN

## 2022-08-10 SURGICAL SUPPLY — 40 items
APL PRP STRL LF DISP 70% ISPRP (MISCELLANEOUS) ×2
APL SKNCLS STERI-STRIP NONHPOA (GAUZE/BANDAGES/DRESSINGS) ×1
BENZOIN TINCTURE PRP APPL 2/3 (GAUZE/BANDAGES/DRESSINGS) ×2 IMPLANT
CHLORAPREP W/TINT 26 (MISCELLANEOUS) ×4 IMPLANT
CLAMP UMBILICAL CORD (MISCELLANEOUS) ×2 IMPLANT
CLOTH BEACON ORANGE TIMEOUT ST (SAFETY) ×2 IMPLANT
DRAPE C SECTION CLR SCREEN (DRAPES) ×2 IMPLANT
DRSG OPSITE POSTOP 4X10 (GAUZE/BANDAGES/DRESSINGS) ×2 IMPLANT
ELECT REM PT RETURN 9FT ADLT (ELECTROSURGICAL) ×1
ELECTRODE REM PT RTRN 9FT ADLT (ELECTROSURGICAL) ×2 IMPLANT
EXTRACTOR VACUUM KIWI (MISCELLANEOUS) IMPLANT
GAUZE SPONGE 4X4 12PLY STRL (GAUZE/BANDAGES/DRESSINGS) IMPLANT
GLOVE BIO SURGEON STRL SZ 6.5 (GLOVE) ×2 IMPLANT
GLOVE BIOGEL PI IND STRL 7.0 (GLOVE) ×4 IMPLANT
GLOVE SURG SS PI 6.5 STRL IVOR (GLOVE) ×2 IMPLANT
GOWN STRL REUS W/ TWL LRG LVL3 (GOWN DISPOSABLE) ×6 IMPLANT
GOWN STRL REUS W/TWL LRG LVL3 (GOWN DISPOSABLE) ×3
KIT ABG SYR 3ML LUER SLIP (SYRINGE) IMPLANT
NDL HYPO 25X5/8 SAFETYGLIDE (NEEDLE) IMPLANT
NEEDLE HYPO 25X5/8 SAFETYGLIDE (NEEDLE) IMPLANT
NS IRRIG 1000ML POUR BTL (IV SOLUTION) ×2 IMPLANT
PACK C SECTION WH (CUSTOM PROCEDURE TRAY) ×2 IMPLANT
PAD ABD 8X10 STRL (GAUZE/BANDAGES/DRESSINGS) IMPLANT
PAD OB MATERNITY 4.3X12.25 (PERSONAL CARE ITEMS) ×2 IMPLANT
RTRCTR C-SECT PINK 25CM LRG (MISCELLANEOUS) ×2 IMPLANT
STRIP CLOSURE SKIN 1/2X4 (GAUZE/BANDAGES/DRESSINGS) ×2 IMPLANT
SUT MNCRL 0 VIOLET CTX 36 (SUTURE) ×4 IMPLANT
SUT MNCRL+ AB 3-0 CT1 36 (SUTURE) ×4 IMPLANT
SUT MONOCRYL 0 CTX 36 (SUTURE) ×2
SUT MONOCRYL AB 3-0 CT1 36IN (SUTURE) ×3
SUT PDS AB 0 CTX 36 PDP370T (SUTURE) ×4 IMPLANT
SUT PLAIN 0 NONE (SUTURE) IMPLANT
SUT VIC AB 0 CTX 36 (SUTURE) ×2
SUT VIC AB 0 CTX36XBRD ANBCTRL (SUTURE) IMPLANT
SUT VIC AB 2-0 CT1 27 (SUTURE)
SUT VIC AB 2-0 CT1 TAPERPNT 27 (SUTURE) IMPLANT
SUT VIC AB 4-0 KS 27 (SUTURE) ×2 IMPLANT
TOWEL OR 17X24 6PK STRL BLUE (TOWEL DISPOSABLE) ×4 IMPLANT
TRAY FOLEY W/BAG SLVR 14FR LF (SET/KITS/TRAYS/PACK) ×2 IMPLANT
WATER STERILE IRR 1000ML POUR (IV SOLUTION) ×2 IMPLANT

## 2022-08-10 NOTE — Anesthesia Postprocedure Evaluation (Signed)
Anesthesia Post Note  Patient: Sylvia Mack  Procedure(s) Performed: [place]     Patient location during evaluation: PACU Anesthesia Type: Epidural Level of consciousness: awake and alert Pain management: pain level controlled Vital Signs Assessment: post-procedure vital signs reviewed and stable Respiratory status: spontaneous breathing, respiratory function stable and nonlabored ventilation Cardiovascular status: blood pressure returned to baseline Postop Assessment: epidural receding and no apparent nausea or vomiting Anesthetic complications: no   No notable events documented.  Last Vitals:  Vitals:   08/10/22 1016 08/10/22 1131  BP: 131/77 128/79  Pulse: 92 79  Resp: 18 18  Temp: 36.7 C 36.5 C  SpO2: 97% 96%    Last Pain:  Vitals:   08/10/22 1131  TempSrc: Oral  PainSc: 4    Pain Goal: Patients Stated Pain Goal: 0 (08/10/22 0930)                 [name]

## 2022-08-10 NOTE — Lactation Note (Signed)
This note was copied from a baby's chart. Lactation Consultation Note  Patient Name: Sylvia Mack LPFXT'K Date: 08/10/2022 Reason for consult: Initial assessment;1st time breastfeeding;Primapara;Term Age:26 hours   P1: Term infant at 40+0 weeks Feeding preference: Breast  Arrived to find "[name]" swaddled and asleep in the bassinet.  Mother commented that he last fed approximately 3 hours ago.  Offered to assist with waking and latching; mother receptive.  Taught hand expression and finger fed a few drops to "[name]."  Attempted to latch in the football hold, however, her remained too sleepy to open his mouth for latching.  Placed him STS and he fell asleep.  Mother will feed on cue or at least 8-12 times/24 hours.  Breast feeding basics reviewed.  Encouraged lots of STS, breast massage and hand expression.  Suggested mother call her RN/LC for latch assistance as needed.  No support person present at this time, however, father will return soon.     Maternal Data Has patient been taught Hand Expression?: Yes Does the patient have breastfeeding experience prior to this delivery?: No  Feeding Mother's Current Feeding Choice: Breast Milk  LATCH Score Latch: Too sleepy or reluctant, no latch achieved, no sucking elicited.  Audible Swallowing: None  Type of Nipple: Everted at rest and after stimulation  Comfort (Breast/Nipple): Soft / non-tender  Hold (Positioning): Assistance needed to correctly position infant at breast and maintain latch.  LATCH Score: 5   Lactation Tools Discussed/Used    Interventions Interventions: Breast feeding basics reviewed;Assisted with latch;Skin to skin;Breast massage;Hand express;Position options;Support pillows;Adjust position;Education;LC Services brochure  Discharge Pump: Hands Free  Consult Status Consult Status: Follow-up Date: 08/11/22 Follow-up type: In-patient    [name] 08/10/2022, 12:47 PM

## 2022-08-10 NOTE — Progress Notes (Signed)
LATE ENTRY  Called to room for fetal bradycardia. Fetal heart rate was noted lasting 8 minutes - mostly in the 70s-80s. Patient turned to left side and Terbutaline given. No fetal recovery was noted. Cervix still 8cm by RN. Counseled patient that I recommend proceeding with cesarean delivery. We discussed risks of cesarean delivery which include but not limited to injury to surrounding organs, infection, blood loss, and need for blood transfusion. Patient was consented for blood products.  The patient is aware that bleeding may result in the need for a blood transfusion which includes risk of transmission of HIV (1:2 million), Hepatitis C (1:2 million), and Hepatitis B (1:200 thousand) and transfusion reaction.  Patient voiced understanding of the above risks as well as understanding of indications for blood transfusion.  Patient verbalized understanding and consents to proceed. She was emergently taken to the OR.  Drema Dallas, DO

## 2022-08-10 NOTE — Transfer of Care (Signed)
Immediate Anesthesia Transfer of Care Note  Patient: Sylvia Mack  Procedure(s) Performed: CESAREAN SECTION  Patient Location: PACU  Anesthesia Type:Epidural  Level of Consciousness: awake, alert , and oriented  Airway & Oxygen Therapy: Patient Spontanous Breathing  Post-op Assessment: Report given to RN and Post -op Vital signs reviewed and stable  Post vital signs: Reviewed and stable  Last Vitals:  Vitals Value Taken Time  BP 120/70 08/10/22 0845  Temp    Pulse 117 08/10/22 0848  Resp 21 08/10/22 0848  SpO2 99 % 08/10/22 0848  Vitals shown include unvalidated device data.  Last Pain:  Vitals:   08/10/22 0720  TempSrc:   PainSc: 0-No pain         Complications: No notable events documented.

## 2022-08-10 NOTE — Progress Notes (Addendum)
Daysi A Fink is a 27 y.o. G1P0 at [redacted]w[redacted]d admitted for labor.    Subjective: I was called to evaluate patient for fetal decelerations on the monitor.  Patient comfortable with epidural.  Objective: BP 129/80   Pulse 95   Temp 98.9 F (37.2 C) (Oral)   Resp 20   Ht 5\' 1"  (1.549 m)   Wt 100.7 kg   LMP  (LMP Unknown)   SpO2 99%   BMI 41.95 kg/m  I/O last 3 completed shifts: In: -  Out: 350 [Urine:350] No intake/output data recorded.  FHT:  FHR: 120 bpm, variability: moderate,  accelerations:  Present,  decelerations:  Present Prolonged.  With positional changes prolonged decelerations resolved, early decelerations noted. UC:   irregular, every 3 to 4 minutes SVE:   Dilation: 8 Effacement (%): 80 Station: -2 Exam by:: Dr. Alesia Richards FSE placed in.  Labs: Lab Results  Component Value Date   WBC 12.2 (H) 08/09/2022   HGB 12.0 08/09/2022   HCT 34.8 (L) 08/09/2022   MCV 82.1 08/09/2022   PLT 309 08/09/2022    Assessment / Plan: G1P0 at [redacted]w[redacted]d admitted for labor.    Labor:  May need pitocin augmentation if continues with unchanged cervix.  Preeclampsia:   With gestational HTN. Fetal Wellbeing:  Category II  Continue with close monitoring, positional changes. Pain Control:  Epidural I/D:   GBS Negative.  Anticipated MOD:  NSVD.  Archie Endo, MD 08/10/2022, 7:04 AM

## 2022-08-10 NOTE — Progress Notes (Signed)
OB Progress Note  S: Patient resting comfortably with epidural. Consents to IUPC placement  O: BP 131/86   Pulse 87   Temp 98.9 F (37.2 C) (Oral)   Resp 20   Ht 5\' 1"  (1.549 m)   Wt 100.7 kg   LMP  (LMP Unknown)   SpO2 99%   BMI 41.95 kg/m   FHT: 125bpm, moderate variablity, - accels, early decels Toco: q1-3 minutes SVE: 8/80/-3, IUPC placed without issue  A/P: 27 y.o. G1P0 @ [redacted]w[redacted]d admitted for normal labor.  FWB: Cat. I, previously Category II with prolonged fetal heart rate decelerations and late decelerations Labor course: Progressing naturally, IUPC placed - discussed augmentation with Pitocin Pain: Epidural GBS: Negative Anticipate SVD  Drema Dallas, DO

## 2022-08-10 NOTE — Op Note (Signed)
Pre Op Dx:   1. Single live IUP at [redacted]w[redacted]d 2. Fetal bradycardia  Post Op Dx:  Same as pre-op  Procedure:   Low Transverse Cesarean Section  Surgeon:  Dr. Wellington Hampshire. Delora Fuel Assistants:  Dr. Gifford Shave [facility] Fellow) Anesthesia:  Epidural  EBL:  452cc IVF:  1900cc crystalloid UOP:  100cc  Drains:  Foley catheter  Specimen removed:  Placenta - sent to pathology  Unable to obtain cord gases Device(s) implanted:  None Case Type:  Clean-contaminated Findings: Normal-appearing uterus, bilateral fallopian tubes, and ovaries. Fetus in LOA position, amniotic fluid clear. No nuchal noted. APGAR: 9/9. Infant weight: 3660g (8lb 1.1oz). Complications: None Indications:  27 y.o. G1P0 admitted for natural labor with fetal bradycardia lasting 7 minutes.  Procedure:  After informed consent was obtained, the patient was brought to the operating room.  Following re-dosing of epidural anesthesia, the patient was positioned in dorsal supine position with a leftward tilt and was prepped and draped in sterile fashion.  A preoperative time-out was not performed due to the emergent nature of procedure.  The abdomen was entered in layers through a pfannenstiel incision and a retractor was placed.  A low transverse hysterotomy was created sharply to the level of the membranes, then extended bluntly.  The fetus was delivered from cephalic presentation onto the field.  Bulb suctioning was performed.  The cord was doubly clamped and cut after a 60 second pause.  The newborn was passed to the warmer.  The placenta was delivered.  The uterus was swept free of clots and debris and closed in a running locked fashion with 0-Monocryl. A second imbricating layer was used to close the uterus using 0-Monocryl. A figure-of-eight suture was placed in the middle of the hysterotomy for additional hemostasis.  Hemostasis was verified.  The abdomen was irrigated with warmed saline and cleared of clots.  The peritoneum was closed in a  running fashion with 2-0 Vicryl.  Subfascial spaces were inspected and hemostasis assured.  The fascia was closed in a running fashion with 0-Vicryl.  The subcutaneous tissues were irrigated and hemostasis assured.  The subcutaneous tissues were closed with 3-0 Monocryl.  The skin was closed with 4-0 Vicryl.  A sterile bandage was applied.  The patient was transferred to PACU.  All needle, sponge, and instrument counts were correct at the end of the case.    Disposition:  PACU  Comments: I performed the procedure and the assistant was needed due to the complexity of the anatomy. An experienced assistant was required given the standard of surgical care given the complexity of the case.  This assistant was needed for exposure, dissection, suctioning, retraction, instrument exchange, assisting with delivery with administration of fundal pressure, and for overall help during the procedure.    Drema Dallas, DO

## 2022-08-10 NOTE — Progress Notes (Addendum)
Sylvia Mack is a 27 y.o. G1P0 at [redacted]w[redacted]d admitted for labor.   Subjective: Patient is comfortable with epidural.  Objective: BP 137/85   Pulse 77   Temp 98.9 F (37.2 C) (Oral)   Resp 20   Ht 5\' 1"  (1.549 m)   Wt 100.7 kg   LMP  (LMP Unknown)   SpO2 99%   BMI 41.95 kg/m  No intake/output data recorded. Total I/O In: -  Out: 350 [Urine:350]    08/10/2022    6:00 AM 08/10/2022    5:30 AM 08/10/2022    5:00 AM  Vitals with BMI  Systolic 250 539 767  Diastolic 80 85 73  Pulse 95 77 104    FHT:  FHR: 130 bpm, variability: moderate,  accelerations:  Present,  decelerations:  Present, late.  UC:   regular, every 3 to 4 minutes SVE:   Dilation: 8 Effacement (%): 80 Station: -3 Exam by:: Dr. Alesia Richards AROM of forebag performed, copious clear fluid released.   Labs: Lab Results  Component Value Date   WBC 12.2 (H) 08/09/2022   HGB 12.0 08/09/2022   HCT 34.8 (L) 08/09/2022   MCV 82.1 08/09/2022   PLT 309 08/09/2022   CMP     Component Value Date/Time   NA 134 (L) 08/09/2022 2204   K 4.0 08/09/2022 2204   CL 103 08/09/2022 2204   CO2 22 08/09/2022 2204   GLUCOSE 83 08/09/2022 2204   BUN 5 (L) 08/09/2022 2204   CREATININE 0.68 08/09/2022 2204   CALCIUM 9.1 08/09/2022 2204   PROT 6.5 08/09/2022 2204   ALBUMIN 2.9 (L) 08/09/2022 2204   AST 22 08/09/2022 2204   ALT 10 08/09/2022 2204   ALKPHOS 136 (H) 08/09/2022 2204   BILITOT 0.5 08/09/2022 2204   GFRNONAA >60 08/09/2022 2204    Protein / creatinine ratio, urine Order: [id number] Status: Final result     Visible to patient: No (scheduled for 08/10/2022  6:35 AM)     Next appt: None   0 Result Notes     Component Ref Range & Units 04:54  Creatinine, Urine mg/dL 163  Total Protein, Urine mg/dL 18  Comment: NO NORMAL RANGE ESTABLISHED FOR THIS TEST  Protein Creatinine Ratio 0.00 - 0.15 mg/mgCre 0.11      Assessment / Plan: 27 y.o. G1P0 at [redacted]w[redacted]d admitted for labor. Spontaneous labor, progressing  normally  Labor: Patient is s/p SROM at 1 am. AROM of forebag done.  Preeclampsia:   None. With gestational HTN.  Fetal Wellbeing:  Category II.  Positional changes done.  Pain Control:  Epidural I/D:   GBS negative.  Anticipated MOD:  NSVD  Archie Endo, MD 08/10/2022, 6:03 AM

## 2022-08-11 ENCOUNTER — Encounter (HOSPITAL_COMMUNITY): Payer: Self-pay | Admitting: Obstetrics and Gynecology

## 2022-08-11 LAB — CBC
HCT: 27.4 % — ABNORMAL LOW (ref 36.0–46.0)
Hemoglobin: 8.9 g/dL — ABNORMAL LOW (ref 12.0–15.0)
MCH: 27.6 pg (ref 26.0–34.0)
MCHC: 32.5 g/dL (ref 30.0–36.0)
MCV: 84.8 fL (ref 80.0–100.0)
Platelets: 255 10*3/uL (ref 150–400)
RBC: 3.23 MIL/uL — ABNORMAL LOW (ref 3.87–5.11)
RDW: 13.3 % (ref 11.5–15.5)
WBC: 17.7 10*3/uL — ABNORMAL HIGH (ref 4.0–10.5)
nRBC: 0 % (ref 0.0–0.2)

## 2022-08-11 NOTE — Progress Notes (Signed)
Postpartum Note Day #1  S:  Patient doing well.  Pain controlled.  Tolerating regular diet.   Ambulating and voiding without difficulty.   Denies fevers, chills, chest pain, SOB, N/V, or worsening bilateral LE edema.  Lochia: Minimal Infant feeding:  Breast and formula Circumcision:  Desires prior to discharge Contraception:  POPs  O: Temp:  [97.6 F (36.4 C)-98.7 F (37.1 C)] 97.6 F (36.4 C) (02/06 0319) Pulse Rate:  [65-118] 65 (02/06 0319) Resp:  [16-26] 18 (02/06 0319) BP: (109-139)/(70-84) 116/76 (02/06 0319) SpO2:  [96 %-100 %] 100 % (02/06 0319) Gen: NAD, pleasant and cooperative CV: Regular rate Resp: No increased work of breathing Abdomen: soft, non-distended, non-tender throughout Uterus: firm, non-tender, below umbilicus Incision: c/d/i, pressure bandage in place  Ext: Trace bilateral LE edema, no bilateral calf tenderness, SCDs on and working  Labs:  Recent Labs    08/09/22 2051 08/11/22 0514  HGB 12.0 8.9*  HCT 34.8* 27.4*    A/P: Patient is a 27 y.o. G1P1001 POD#1 s/p LTCS.  - Pain well controlled  - GU: UOP is adequate - GI: Tolerating regular diet - Activity: encouraged sitting up to chair and ambulation as tolerated - DVT Prophylaxis: Lovenox, SCDs - Labs: as above - oral iron ordered for acute blood loss anemia  Circumcision consent: Routine circumcisions performed on newborns have been identified as voluntary, elective procedures by [employer] such as the [employer].  It is considered an elective procedure with no definitive medical indication and carries risks.  Risks include but are not limited to bleeding, infection, damage to penis with possible need for further surgery, poor cosmesis, and local anesthetic risks.  Circumcision will only be performed if patient is deemed to have normal anatomy by his Pediatrician, meets adequate criteria for a newborn of similar gestational age after birth and is without infection  or other medical issue contraindicating an elective procedure.   Patient understands and agrees with above consent Patient discussed with parents of infant.   Disposition:  D/C home POD#2-3.   Drema Dallas, DO [phone] (office)

## 2022-08-12 ENCOUNTER — Other Ambulatory Visit (HOSPITAL_COMMUNITY): Payer: Self-pay

## 2022-08-12 LAB — SURGICAL PATHOLOGY

## 2022-08-12 MED ORDER — ACETAMINOPHEN 500 MG PO TABS
1000.0000 mg | ORAL_TABLET | Freq: Four times a day (QID) | ORAL | 1 refills | Status: AC | PRN
  Filled 2022-08-12: qty 60, 8d supply, fill #0

## 2022-08-12 MED ORDER — IBUPROFEN 600 MG PO TABS
600.0000 mg | ORAL_TABLET | Freq: Four times a day (QID) | ORAL | 1 refills | Status: AC | PRN
  Filled 2022-08-12: qty 60, 15d supply, fill #0

## 2022-08-12 MED ORDER — DOCUSATE SODIUM 100 MG PO CAPS
100.0000 mg | ORAL_CAPSULE | Freq: Two times a day (BID) | ORAL | 3 refills | Status: AC | PRN
  Filled 2022-08-12: qty 60, 30d supply, fill #0

## 2022-08-12 MED ORDER — OXYCODONE HCL 5 MG PO TABS
5.0000 mg | ORAL_TABLET | Freq: Four times a day (QID) | ORAL | 0 refills | Status: AC | PRN
  Filled 2022-08-12: qty 30, 7d supply, fill #0

## 2022-08-12 MED ORDER — FERROUS SULFATE 325 (65 FE) MG PO TABS
325.0000 mg | ORAL_TABLET | Freq: Two times a day (BID) | ORAL | 3 refills | Status: AC
  Filled 2022-08-12: qty 60, 30d supply, fill #0

## 2022-08-12 NOTE — Lactation Note (Signed)
This note was copied from a baby's chart. Lactation Consultation Note  Patient Name: Sylvia Mack SLPNP'Y Date: 08/12/2022   Age:26 hours Mom resting. Maternal Data    Feeding Nipple Type: Slow - flow  LATCH Score                    Lactation Tools Discussed/Used    Interventions    Discharge    Consult Status      Theodoro Kalata 08/12/2022, 5:48 AM

## 2022-08-12 NOTE — Discharge Summary (Signed)
Postpartum Discharge Summary  Date of Service: 08/12/22      Patient Name: Sylvia Mack DOB: 01/07/1996 MRN: [id number]  Date of admission: 08/09/2022 Delivery date:08/10/2022  Delivering provider: Drema Dallas  Date of discharge: 08/12/2022  Admitting diagnosis: Normal labor [O80, Z37.9] Gestational HTN Intrauterine pregnancy: [redacted]w[redacted]d     Secondary diagnosis:  Principal Problem:   Normal labor  Additional problems: Obesity (BMI 41)    Discharge diagnosis: Term Pregnancy Delivered                                              Post partum procedures: None Augmentation: AROM Complications: Fetal bradycardia  Hospital course: Onset of Labor With Unplanned C/S   27 y.o. yo G1P1001 at [redacted]w[redacted]d was admitted in Active Labor on 08/09/2022. Patient had a labor course significant for fetal bradycardia. The patient went for cesarean section due to  Fetal bradycardia . Delivery details as follows: Membrane Rupture Time/Date: 2:00 AM ,08/10/2022   Delivery Method:C-Section, Low Transverse  Details of operation can be found in separate operative note. Patient had a postpartum course complicated by nothing. She ruled in for gestational HTN during her admission. Preeclampsia labs were normal and PCR 0.11. Her blood pressures normalized postpartum.  She is ambulating,tolerating a regular diet, passing flatus, and urinating well.  Patient is discharged home in stable condition 08/12/22.  Newborn Data: Birth date:08/10/2022  Birth time:7:50 AM  Gender:Female  Living status:Living  Apgars:9 ,9  Weight:3660 g   Magnesium Sulfate received: No BMZ received: No Rhophylac:N/A MMR:N/A T-DaP:Given prenatally Flu: Received prenatally Transfusion:No  Physical exam  Vitals:   08/11/22 0319 08/11/22 1341 08/11/22 2039 08/12/22 0510  BP: 116/76 114/69 129/80 123/66  Pulse: 65 81 79 84  Resp: 18 17 16 18   Temp: 97.6 F (36.4 C) 98.3 F (36.8 C) 98.5 F (36.9 C)   TempSrc: Oral Oral Oral   SpO2: 100% 100%  99%   Weight:      Height:       General: alert, cooperative, and no distress Lochia: appropriate Uterine Fundus: firm Incision: Dressing is clean, dry, and intact DVT Evaluation: No evidence of DVT seen on physical exam. No cords or calf tenderness. Labs: Lab Results  Component Value Date   WBC 17.7 (H) 08/11/2022   HGB 8.9 (L) 08/11/2022   HCT 27.4 (L) 08/11/2022   MCV 84.8 08/11/2022   PLT 255 08/11/2022      Latest Ref Rng & Units 08/09/2022   10:04 PM  CMP  Glucose 70 - 99 mg/dL 83   BUN 6 - 20 mg/dL 5   Creatinine 0.44 - 1.00 mg/dL 0.68   Sodium 135 - 145 mmol/L 134   Potassium 3.5 - 5.1 mmol/L 4.0   Chloride 98 - 111 mmol/L 103   CO2 22 - 32 mmol/L 22   Calcium 8.9 - 10.3 mg/dL 9.1   Total Protein 6.5 - 8.1 g/dL 6.5   Total Bilirubin 0.3 - 1.2 mg/dL 0.5   Alkaline Phos 38 - 126 U/L 136   AST 15 - 41 U/L 22   ALT 0 - 44 U/L 10    Edinburgh Score:    08/10/2022   12:49 PM  Edinburgh Postnatal Depression Scale Screening Tool  I have been able to laugh and see the funny side of things. 0  I have looked forward with enjoyment to  things. 0  I have blamed myself unnecessarily when things went wrong. 0  I have been anxious or worried for no good reason. 0  I have felt scared or panicky for no good reason. 0  Things have been getting on top of me. 0  I have been so unhappy that I have had difficulty sleeping. 0  I have felt sad or miserable. 0  I have been so unhappy that I have been crying. 0  The thought of harming myself has occurred to me. 0  Edinburgh Postnatal Depression Scale Total 0      After visit meds:  Allergies as of 08/12/2022   No Known Allergies      Medication List     STOP taking these medications    albuterol 108 (90 Base) MCG/ACT inhaler Commonly known as: VENTOLIN HFA   ferrous sulfate 325 (65 FE) MG EC tablet Replaced by: ferrous sulfate 325 (65 FE) MG tablet   guaiFENesin 600 MG 12 hr tablet Commonly known as: Mucinex        TAKE these medications    acetaminophen 500 MG tablet Commonly known as: TYLENOL Take 2 tablets (1,000 mg total) by mouth every 6 (six) hours as needed for moderate pain, headache or mild pain.   docusate sodium 100 MG capsule Commonly known as: Colace Take 1 capsule (100 mg total) by mouth 2 (two) times daily as needed for mild constipation.   ferrous sulfate 325 (65 FE) MG tablet Take 1 tablet (325 mg total) by mouth 2 (two) times daily with a meal. Replaces: ferrous sulfate 325 (65 FE) MG EC tablet   ibuprofen 600 MG tablet Commonly known as: ADVIL Take 1 tablet (600 mg total) by mouth every 6 (six) hours as needed for cramping, mild pain or moderate pain.   oxyCODONE 5 MG immediate release tablet Commonly known as: Oxy IR/ROXICODONE Take 1-2 tablets (5-10 mg total) by mouth every 6 (six) hours as needed for severe pain or breakthrough pain.   prenatal multivitamin Tabs tablet Take 1 tablet by mouth daily.         Discharge home in stable condition Infant Feeding: Breast Infant Disposition:home with mother Discharge instruction: per After Visit Summary and Postpartum booklet. Activity: Advance as tolerated. Pelvic rest for 6 weeks.  Diet: routine diet Anticipated Birth Control: POPs Postpartum Appointment:6 weeks Additional Postpartum F/U: Incision check 2 weeks and BP check 1 week Future Appointments:No future appointments. Follow up Visit:  Follow-up Information     Drema Dallas, DO Follow up in 2 week(s).   Specialty: Obstetrics and Gynecology Why: Our office will call you with a 2 week incision check and your 6 week postpartum visit. Contact information: [address] [place] [address] [phone]                     08/12/2022 Drema Dallas, DO

## 2022-08-12 NOTE — Lactation Note (Signed)
This note was copied from a baby's chart. Lactation Consultation Note  Patient Name: Sylvia Mack [id number] Date: 08/12/2022 Reason for consult: Follow-up assessment;Term Age:26 hours   P1: Term infant at 40+0 weeks Feeding preference: Breast/formula Weight loss: 2%  "[name]" was swaddled and asleep in the bassinet when I arrived.  Mother reported that he is breast feeding well and she started supplementation yesterday.  Baby is consuming between 5=50 mls after breast feeding.  Mother desires to begin pumping some after she gets home today.  Family has our op phone number for any questions/concerns after discharge.  Father present.   Maternal Data    Feeding Mother's Current Feeding Choice: Breast Milk and Formula Nipple Type: Slow - flow  LATCH Score                    Lactation Tools Discussed/Used    Interventions Interventions: Education  Discharge Discharge Education: Engorgement and breast care Pump: Hands Free  Consult Status Consult Status: Complete Date: 08/12/22 Follow-up type: Call as needed    Lalanya Rufener R Chrysten Woulfe 08/12/2022, 7:30 AM

## 2022-08-12 NOTE — Progress Notes (Signed)
Postpartum Note Day #2  S:  Patient doing well.  Pain controlled.  Tolerating regular diet.   Ambulating and voiding without difficulty.  Desires discharge home today if infant is discharged. Denies fevers, chills, chest pain, SOB, N/V, or worsening bilateral LE edema.  Lochia: Minimal Infant feeding:  Breast and formula Circumcision:  Desires prior to discharge Contraception:  POPs  O: Temp:  [98.3 F (36.8 C)-98.5 F (36.9 C)] 98.5 F (36.9 C) (02/06 2039) Pulse Rate:  [79-84] 84 (02/07 0510) Resp:  [16-18] 18 (02/07 0510) BP: (114-129)/(66-80) 123/66 (02/07 0510) SpO2:  [99 %-100 %] 99 % (02/06 2039) Gen: NAD, pleasant and cooperative CV: Regular rate Resp: No increased work of breathing Abdomen: soft, non-distended, non-tender throughout Uterus: firm, non-tender, below umbilicus Incision: c/d/i, pressure bandage in place  Ext: Trace bilateral LE edema, no bilateral calf tenderness, SCDs on and working  Labs:  Recent Labs    08/09/22 2051 08/11/22 0514  HGB 12.0 8.9*  HCT 34.8* 27.4*     A/P: Patient is a 27 y.o. G1P1001 POD#2 s/p LTCS.  - Pain well controlled  - GU: UOP is adequate - GI: Tolerating regular diet - Activity: encouraged sitting up to chair and ambulation as tolerated - DVT Prophylaxis: Lovenox, SCDs - Labs: as above - oral iron ordered for acute blood loss anemia  Disposition:  D/C home today - infant will likely discharge home.  Drema Dallas, DO [phone] (office)

## 2022-08-17 ENCOUNTER — Inpatient Hospital Stay (HOSPITAL_COMMUNITY)
Admission: RE | Admit: 2022-08-17 | Payer: BC Managed Care – PPO | Source: Home / Self Care | Admitting: Obstetrics and Gynecology

## 2022-08-17 ENCOUNTER — Inpatient Hospital Stay (HOSPITAL_COMMUNITY): Payer: BC Managed Care – PPO

## 2022-08-22 ENCOUNTER — Telehealth (HOSPITAL_COMMUNITY): Payer: Self-pay

## 2022-08-22 NOTE — Telephone Encounter (Signed)
Patient did not answer phone call. Mailbox is full and cannot except messages.  Sharyn Lull [facility] 08/22/22,0950
